# Patient Record
Sex: Male | Born: 1970 | Race: Black or African American | Hispanic: No | Marital: Married | Smoking: Never smoker
Health system: Southern US, Community
[De-identification: ages and names within clinical notes are randomized; demographics above are authoritative.]

## PROBLEM LIST (undated history)

## (undated) HISTORY — PX: HERNIA REPAIR: SHX51

---

## 2012-05-17 ENCOUNTER — Emergency Department: Payer: Self-pay | Admitting: Internal Medicine

## 2012-05-17 LAB — COMPREHENSIVE METABOLIC PANEL
Albumin: 3.9 g/dL (ref 3.4–5.0)
Alkaline Phosphatase: 63 U/L (ref 50–136)
Anion Gap: 8 (ref 7–16)
BUN: 11 mg/dL (ref 7–18)
Bilirubin,Total: 0.3 mg/dL (ref 0.2–1.0)
Calcium, Total: 8.6 mg/dL (ref 8.5–10.1)
Creatinine: 1.18 mg/dL (ref 0.60–1.30)
EGFR (African American): >60
Glucose: 122 mg/dL — ABNORMAL HIGH (ref 65–99)
Potassium: 3.4 mmol/L — ABNORMAL LOW (ref 3.5–5.1)
SGOT(AST): 24 U/L (ref 15–37)
Total Protein: 7.5 g/dL (ref 6.4–8.2)

## 2012-05-17 LAB — URINALYSIS, COMPLETE
Bacteria: NONE SEEN
Glucose,UR: NEGATIVE mg/dL (ref 0–75)
Ketone: NEGATIVE
Leukocyte Esterase: NEGATIVE
Specific Gravity: 1.019 (ref 1.003–1.030)
WBC UR: 1 /HPF (ref 0–5)

## 2012-05-17 LAB — CBC
MCH: 30.5 pg (ref 26.0–34.0)
MCHC: 33.4 g/dL (ref 32.0–36.0)
MCV: 91 fL (ref 80–100)
Platelet: 130 10*3/uL — ABNORMAL LOW (ref 150–440)
RDW: 12.7 % (ref 11.5–14.5)

## 2013-08-03 ENCOUNTER — Ambulatory Visit: Payer: Self-pay

## 2013-08-14 ENCOUNTER — Encounter: Payer: Self-pay | Admitting: Nurse Practitioner

## 2013-09-06 ENCOUNTER — Encounter: Payer: Self-pay | Admitting: Nurse Practitioner

## 2013-09-17 ENCOUNTER — Ambulatory Visit: Payer: Self-pay | Admitting: Orthopedic Surgery

## 2013-09-21 LAB — WOUND CULTURE

## 2014-10-30 NOTE — Op Note (Signed)
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   PROCEDURE: Irrigation and debridement of lateral ankle with removal of deep hardware distal fibula and tibia.   ANESTHESIA: General.   SURGEON: Kennedy BuckerMichael Antwoine Zorn, Decker.D.   DESCRIPTION OF PROCEDURE: The patient was brought to the operating room and after adequate anesthesia was obtained, the leg was prepped and draped in the usual sterile fashion with the patient in a lateral decubitus position with a bean bag holding him in this position. The left leg was prepped and draped in the usual sterile fashion with a tourniquet applied to the upper thigh and appropriate patient identification and timeout procedure were completed. There were 2 areas of drainage just anterior to the prior surgical scar on the lateral ankle and approximately 1 cm ulcerated area medially, both of these were cultured. The tourniquet was raised and the prior posterior incision was opened. There was dense scar tissue adherent all the way to essentially the posterior aspect of the tibia without good tissue planes. When the plate was exposed, a third culture was obtained. Subsequently with exposure of the plate, there was bony overgrowth consistent with osteomyelitis. This was removed with the use of an osteotome and this also was sent as a specimen. The fibular plate was removed with the 5 screws removed and, what appeared to be,  sequestrum at the proximal end of the plate also debrided with use of rongeur and osteotome.   Next, the posterior screw from the distal tibia was exposed. It was difficult to remove secondary to its head being embedded the soft tissues. Care being taken not to damage the Achilles tendon as it appeared to be embedded deep in this area. It was  worked out and removed. A curette was then used to debride the hole from which it had come. At this point, the wound was thoroughly irrigated with pulsatile lavage with the medial and lateral small wounds irrigated as well as the large posterior wound. Next, the tourniquet was let down and hemostasis checked with electrocautery. The wound was closed with vertical mattress sutures. Xeroform, 4 x 4, Webril and Ace wrap for postop bandage. The patient was sent to the recovery room in stable condition.   ESTIMATED BLOOD LOSS: Minimal.   TOURNIQUET TIME: 57 minutes at 325 mmHg.   SPECIMEN: Multiple cultures. Hardware was discarded after picture obtained. The hardware had no evidence of fracture or failure. There were small areas of purulent material present. This appeared to be thoroughly irrigated by the time of the close of the case.    ____________________________ Leitha SchullerMichael J. Virda Betters, MD mjm:aw D: 09/17/2013 18:34:47 ET T: 09/18/2013 06:24:50 ET JOB#: [id number]  cc: Leitha SchullerMichael J. Maliha Outten, MD, <Dictator> Leitha SchullerMICHAEL J Yves Fodor MD ELECTRONICALLY SIGNED 09/18/2013 10:58

## 2018-06-28 ENCOUNTER — Emergency Department (HOSPITAL_COMMUNITY)
Admission: EM | Admit: 2018-06-28 | Discharge: 2018-06-28 | Disposition: A | Discharge status: Home / Self Care | Payer: Self-pay | Attending: Emergency Medicine | Admitting: Emergency Medicine

## 2018-06-28 ENCOUNTER — Other Ambulatory Visit: Payer: Self-pay

## 2018-06-28 ENCOUNTER — Emergency Department (HOSPITAL_COMMUNITY): Payer: Self-pay

## 2018-06-28 ENCOUNTER — Encounter (HOSPITAL_COMMUNITY): Payer: Self-pay | Admitting: Emergency Medicine

## 2018-06-28 DIAGNOSIS — R51 Headache: Secondary | ICD-10-CM | POA: Insufficient documentation

## 2018-06-28 DIAGNOSIS — R519 Headache, unspecified: Secondary | ICD-10-CM

## 2018-06-28 LAB — CBC WITH DIFFERENTIAL/PLATELET
Abs Immature Granulocytes: 0.02 10*3/uL (ref 0.00–0.07)
BASOS PCT: 1 %
Basophils Absolute: 0 10*3/uL (ref 0.0–0.1)
EOS ABS: 0.1 10*3/uL (ref 0.0–0.5)
Eosinophils Relative: 1 %
HCT: 42.5 % (ref 39.0–52.0)
Hemoglobin: 13.8 g/dL (ref 13.0–17.0)
Immature Granulocytes: 0 %
Lymphocytes Relative: 52 %
Lymphs Abs: 3.3 10*3/uL (ref 0.7–4.0)
MCH: 29.7 pg (ref 26.0–34.0)
MCHC: 32.5 g/dL (ref 30.0–36.0)
MCV: 91.6 fL (ref 80.0–100.0)
Monocytes Absolute: 0.6 10*3/uL (ref 0.1–1.0)
Monocytes Relative: 9 %
Neutro Abs: 2.4 10*3/uL (ref 1.7–7.7)
Neutrophils Relative %: 37 %
PLATELETS: DECREASED 10*3/uL (ref 150–400)
RBC: 4.64 MIL/uL (ref 4.22–5.81)
RDW: 12.1 % (ref 11.5–15.5)
WBC: 6.4 10*3/uL (ref 4.0–10.5)
nRBC: 0 % (ref 0.0–0.2)

## 2018-06-28 LAB — URINALYSIS, ROUTINE W REFLEX MICROSCOPIC
Bilirubin Urine: NEGATIVE
GLUCOSE, UA: NEGATIVE mg/dL
Hgb urine dipstick: NEGATIVE
Ketones, ur: 15 mg/dL — AB
Leukocytes, UA: NEGATIVE
Nitrite: NEGATIVE
Protein, ur: NEGATIVE mg/dL
Specific Gravity, Urine: 1.02 (ref 1.005–1.030)
pH: 6 (ref 5.0–8.0)

## 2018-06-28 LAB — BASIC METABOLIC PANEL
Anion gap: 9 (ref 5–15)
BUN: 11 mg/dL (ref 6–20)
CALCIUM: 8.6 mg/dL — AB (ref 8.9–10.3)
CO2: 27 mmol/L (ref 22–32)
Chloride: 104 mmol/L (ref 98–111)
Creatinine, Ser: 1.24 mg/dL (ref 0.61–1.24)
GFR calc Af Amer: >60 mL/min (ref >60)
GFR calc non Af Amer: >60 mL/min (ref >60)
Glucose, Bld: 89 mg/dL (ref 70–99)
Potassium: 3.6 mmol/L (ref 3.5–5.1)
Sodium: 140 mmol/L (ref 135–145)

## 2018-06-28 MED ORDER — DIPHENHYDRAMINE HCL 50 MG/ML IJ SOLN
12.5000 mg | Freq: Once | INTRAMUSCULAR | Status: AC
  Administered 2018-06-28: 12.5 mg via INTRAVENOUS
  Filled 2018-06-28: qty 1

## 2018-06-28 MED ORDER — PROCHLORPERAZINE EDISYLATE 10 MG/2ML IJ SOLN
10.0000 mg | Freq: Once | INTRAMUSCULAR | Status: AC
  Administered 2018-06-28: 10 mg via INTRAVENOUS
  Filled 2018-06-28: qty 2

## 2018-06-28 MED ORDER — SODIUM CHLORIDE 0.9 % IV BOLUS
1000.0000 mL | Freq: Once | INTRAVENOUS | Status: AC
  Administered 2018-06-28: 1000 mL via INTRAVENOUS

## 2018-06-28 MED ORDER — HALOPERIDOL LACTATE 5 MG/ML IJ SOLN
2.0000 mg | Freq: Once | INTRAMUSCULAR | Status: AC
  Administered 2018-06-28: 2 mg via INTRAVENOUS
  Filled 2018-06-28: qty 1

## 2018-06-28 MED ORDER — KETOROLAC TROMETHAMINE 15 MG/ML IJ SOLN
15.0000 mg | Freq: Once | INTRAMUSCULAR | Status: AC
  Administered 2018-06-28: 15 mg via INTRAVENOUS
  Filled 2018-06-28: qty 1

## 2018-06-28 MED ORDER — NAPROXEN 500 MG PO TABS: 500.0000 mg | ORAL_TABLET | Freq: Two times a day (BID) | ORAL | 0 refills | Status: DC

## 2018-06-28 NOTE — ED Notes (Signed)
Patient verbalizes understanding of discharge instructions. Opportunity for questioning and answers were provided. Armband removed by staff, pt discharged from ED in wheelchair with family.  

## 2018-06-28 NOTE — ED Notes (Signed)
Nurse drawing labs. 

## 2018-06-28 NOTE — ED Provider Notes (Signed)
[facility] EMERGENCY DEPARTMENT Provider Note   CSN: [id number] Arrival date & time: 06/28/18  1855     History   Chief Complaint Chief Complaint  Patient presents with  . Headache  . Fever    HPI Tyler Decker is a 47 y.o. male without significant past medical hx who presents to the ED with complaints of headache for the past 5 days. Patient states headache is located in the frontal area and radiates to the back of the head/neck bilaterally. Pain is a throbbing sensation. States it started gradually and has progressively worsened. Current pain is an 8/10 in severity, he has some improvement w/ taking Excedrin, has not had any Excedrin today. Pain is worse with bright lights and w/ bending over/movements. He states hx of similar headaches that occur approximately once a year with resolution. States he has also had some fevers (temp max 101.7), chills, decreased appetite, and left lower tooth ache. Denies nausea, vomiting, chest pain, dyspnea, syncope, abdominal pain, change in vision, numbness, or weakness.   HPI  History reviewed. No pertinent past medical history.  There are no active problems to display for this patient.   Past Surgical History:  Procedure Laterality Date  . HERNIA REPAIR          Home Medications    Prior to Admission medications   Not on File    Family History No family history on file.  Social History Social History   Tobacco Use  . Smoking status: Not on file  Substance Use Topics  . Alcohol use: Not on file  . Drug use: Not on file     Allergies   Patient has no known allergies.   Review of Systems Review of Systems  Constitutional: Positive for appetite change, chills and fever.  HENT: Positive for dental problem. Negative for congestion, ear pain, sore throat, trouble swallowing and voice change.   Eyes: Positive for photophobia.  Respiratory: Negative for cough and shortness of breath.   Cardiovascular:  Negative for chest pain.  Gastrointestinal: Negative for abdominal pain, nausea and vomiting.  Neurological: Positive for headaches. Negative for facial asymmetry, speech difficulty, weakness and numbness.  All other systems reviewed and are negative.  Physical Exam Updated Vital Signs BP 128/82   Pulse 60   Temp 99.8 F (37.7 C) (Oral)   Resp 16   SpO2 100%   Physical Exam Vitals signs and nursing note reviewed.  Constitutional:      General: He is not in acute distress.    Appearance: He is well-developed. He is not toxic-appearing.  HENT:     Head: Normocephalic and atraumatic.     Right Ear: Tympanic membrane, ear canal and external ear normal.     Left Ear: Tympanic membrane, ear canal and external ear normal.     Nose: Nose normal.     Mouth/Throat:     Mouth: Mucous membranes are moist.     Pharynx: No oropharyngeal exudate or posterior oropharyngeal erythema.     Comments: Scattered dental carries. No notable gingival erythema/swelling/fluctuance. No abscess noted. Posterior oropharynx is symmetric appearing. Patient tolerating own secretions without difficulty. No trismus. No drooling. No hot potato voice. No swelling beneath the tongue, submandibular compartment is soft.  Eyes:     General:        Right eye: No discharge.        Left eye: No discharge.     Conjunctiva/sclera: Conjunctivae normal.  Neck:  Musculoskeletal: Neck supple. No edema, erythema or crepitus.     Comments: ROM intact but patient is uncomfortable with flexion/extension of the neck.  Diffuse midline/paraspinal muscle tenderness, no point/focal vertebral tenderness, no palpable step off.  Cardiovascular:     Rate and Rhythm: Normal rate and regular rhythm.  Pulmonary:     Effort: Pulmonary effort is normal. No respiratory distress.     Breath sounds: Normal breath sounds. No wheezing, rhonchi or rales.  Abdominal:     General: There is no distension.     Palpations: Abdomen is soft.      Tenderness: There is no abdominal tenderness.  Skin:    General: Skin is warm and dry.     Findings: No rash.  Neurological:     Mental Status: He is alert.     Comments: Alert. Clear speech. No facial droop. CNIII-XII grossly intact. Bilateral upper and lower extremities' sensation grossly intact. 5/5 symmetric strength with grip strength and with plantar and dorsi flexion bilaterally. Normal finger to nose bilaterally. Negative pronator drift. Negative Romberg sign. Gait is steady and intact.   Psychiatric:        Behavior: Behavior normal.     ED Treatments / Results  Labs (all labs ordered are listed, but only abnormal results are displayed) Labs Reviewed - No data to display  EKG None  Radiology No results found.  Procedures Procedures (including critical care time)  Medications Ordered in ED Medications - No data to display   Initial Impression / Assessment and Plan / ED Course  I have reviewed the triage vital signs and the nursing notes.  Pertinent labs & imaging results that were available during my care of the patient were reviewed by me and considered in my medical decision making (see chart for details).   Patient presents to the ED with 4-5 days of progressively worsening headache w/ associated fever & chills. Exam with some mild neck stiffness, but ROM intact. No neuro deficits noted. Infectious work-up w/ head CT unremarkable. No leukocytosis. No anemia. No significant electrolyte disturbance- mild hypocalcemia to 8.6. UA negative. CXR negative. CT head negative for bleed or mass. Feel that bacterial meningitis is very unlikely given duration of sxs and patient overall nontoxic appearing. Viral meningitis remains possible, doubt HSV meningitis. Myself & Dr. Pilar PlateBero discussed lumbar puncture for further definitive assessment with patient & his wife, risks/benefits/alternatives discussed, w/ shared decision making patient does not wish to have LP performed, this does not  seem unreasonable- again feel that bacterial meningitis is very unlikely. Presentation additionally does not seem consistent with SAH, ICH, ischemic CVA, dural venous sinus thrombosis, acute glaucoma, giant cell arteritis, ludwigs angina, or mass. Patient feeling better in the department following medications and feels ready to go home. He will be discharged w/ very strict return precautions. PCP follow up. I discussed results, treatment plan, need for follow-up, and return precautions with the patient & his wife. Provided opportunity for questions, patient & his wife confirmed understanding and are in agreement with plan.   Findings and plan of care discussed with supervising physician Dr. Pilar PlateBero who personally evaluated and examined this patient and is in agreement.   Final Clinical Impressions(s) / ED Diagnoses   Final diagnoses:  Acute nonintractable headache, unspecified headache type    ED Discharge Orders         Ordered    naproxen (NAPROSYN) 500 MG tablet  2 times daily     06/28/18 2321  Cherly Andersonetrucelli,  R, PA-C 06/29/18 0021    Sabas SousBero, Michael M, MD 06/30/18 [phone]

## 2018-06-28 NOTE — Discharge Instructions (Signed)
You were seen in the emergency department today for a headache.  Your work-up in the ER was overall reassuring.  The CT of your head was normal.  Your chest x-ray was normal.  Your blood work did not show any significant abnormalities.  As discussed there is always the possibility that this could be a viral meningitis and very much unlikely a bacterial meningitis.  For this reason is been extremely important that you follow-up with your primary care provider in 1 to 3 days.  Please take Excedrin as Naproxen prescribed.   - Naproxen is a nonsteroidal anti-inflammatory medication that will help with pain and swelling. Be sure to take this medication as prescribed with food, 1 pill every 12 hours,  It should be taken with food, as it can cause stomach upset, and more seriously, stomach bleeding. Do not take other nonsteroidal anti-inflammatory medications with this such as Advil, Motrin, Aleve, Mobic, Goodie Powder, or Motrin.    We have prescribed you new medication(s) today. Discuss the medications prescribed today with your pharmacist as they can have adverse effects and interactions with your other medicines including over the counter and prescribed medications. Seek medical evaluation if you start to experience new or abnormal symptoms after taking one of these medicines, seek care immediately if you start to experience difficulty breathing, feeling of your throat closing, facial swelling, or rash as these could be indications of a more serious allergic reaction   .  Please return to the ER immediately should you experience new or worsening symptoms including but not limited to worsening pain, inability to move the neck, inability to keep fluids down, mental status change, confusion, or any other concerns that you may have.

## 2018-06-28 NOTE — ED Triage Notes (Signed)
Pt states he has had a migraine for 2-3 days, has taken Excedrin with relief for only 2 hours then the pain comes back. Pt also states his temp at home 2 days ago was 101F oral. Pt states he recently has felt "wobbly" after standing up fast or bending down to tie his shoe after he has been standing for a while

## 2018-07-28 ENCOUNTER — Emergency Department (HOSPITAL_COMMUNITY): Payer: Self-pay

## 2018-07-28 ENCOUNTER — Encounter (HOSPITAL_COMMUNITY): Payer: Self-pay

## 2018-07-28 ENCOUNTER — Emergency Department (HOSPITAL_COMMUNITY)
Admission: EM | Admit: 2018-07-28 | Discharge: 2018-07-28 | Disposition: A | Discharge status: Home / Self Care | Payer: Self-pay | Attending: Emergency Medicine | Admitting: Emergency Medicine

## 2018-07-28 ENCOUNTER — Other Ambulatory Visit: Payer: Self-pay

## 2018-07-28 DIAGNOSIS — Y92410 Unspecified street and highway as the place of occurrence of the external cause: Secondary | ICD-10-CM | POA: Insufficient documentation

## 2018-07-28 DIAGNOSIS — T148XXA Other injury of unspecified body region, initial encounter: Secondary | ICD-10-CM

## 2018-07-28 DIAGNOSIS — S20212A Contusion of left front wall of thorax, initial encounter: Secondary | ICD-10-CM | POA: Insufficient documentation

## 2018-07-28 DIAGNOSIS — Y998 Other external cause status: Secondary | ICD-10-CM | POA: Insufficient documentation

## 2018-07-28 DIAGNOSIS — S298XXA Other specified injuries of thorax, initial encounter: Secondary | ICD-10-CM

## 2018-07-28 DIAGNOSIS — Y9389 Activity, other specified: Secondary | ICD-10-CM | POA: Insufficient documentation

## 2018-07-28 DIAGNOSIS — S161XXA Strain of muscle, fascia and tendon at neck level, initial encounter: Secondary | ICD-10-CM | POA: Insufficient documentation

## 2018-07-28 MED ORDER — KETOROLAC TROMETHAMINE 60 MG/2ML IM SOLN
30.0000 mg | Freq: Once | INTRAMUSCULAR | Status: AC
  Administered 2018-07-28: 30 mg via INTRAMUSCULAR
  Filled 2018-07-28: qty 2

## 2018-07-28 MED ORDER — METHOCARBAMOL 500 MG PO TABS: 500.0000 mg | ORAL_TABLET | Freq: Two times a day (BID) | ORAL | 0 refills | Status: AC

## 2018-07-28 MED ORDER — CYCLOBENZAPRINE HCL 10 MG PO TABS
10.0000 mg | ORAL_TABLET | Freq: Once | ORAL | Status: AC
  Administered 2018-07-28: 10 mg via ORAL
  Filled 2018-07-28: qty 1

## 2018-07-28 MED ORDER — NAPROXEN 500 MG PO TABS: 500.0000 mg | ORAL_TABLET | Freq: Two times a day (BID) | ORAL | 0 refills | Status: AC
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
## 2018-07-28 NOTE — Discharge Instructions (Addendum)
Do not take the muscle relaxer if driving or working as it will make you sleepy. Follow up with your doctor or return here for worsening symptoms.
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
## 2020-07-22 IMAGING — CT CT HEAD W/O CM
4 series · 16 of 47 positions shown, 18 images · non-contrast
Comparison: None.

CLINICAL DATA: Migraine headache over the last 2-3 days. Fever.
Gait disturbance.

EXAM:
CT HEAD WITHOUT CONTRAST
TECHNIQUE: Contiguous axial images were obtained from the base of the skull
through the vertex without intravenous contrast.

[Series 3: head without · axial · non-contrast · 0.42mm/px · z∈[-93,+27]mm · 7 of 32 slices shown, 9 images]
[im 4/32  brain]
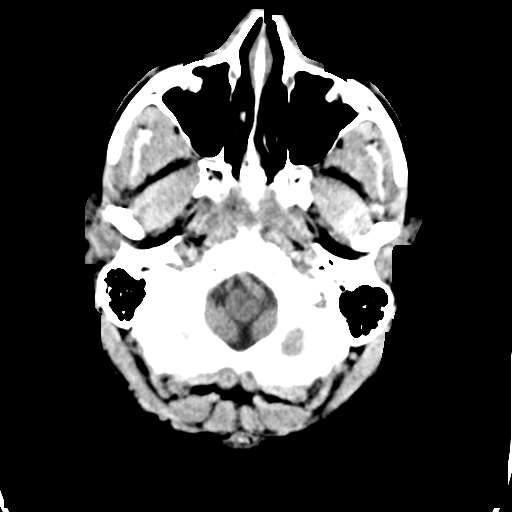
[im 4/32  bone]
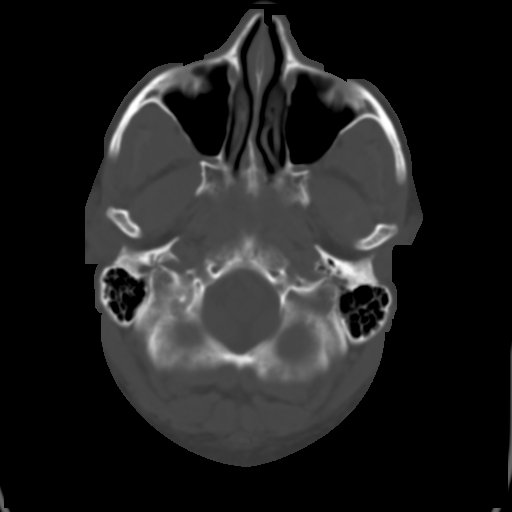
[im 8/32  brain]
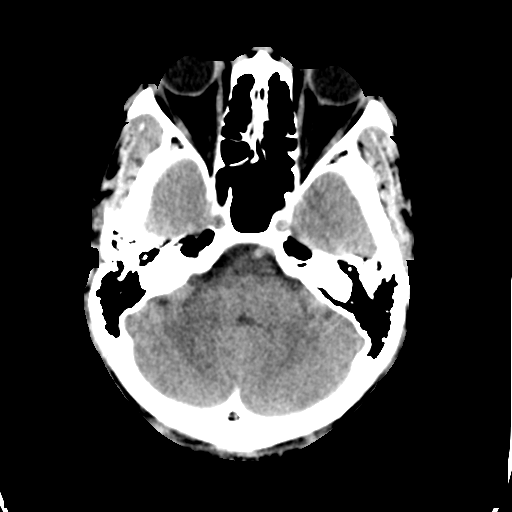
[im 12/32  brain]
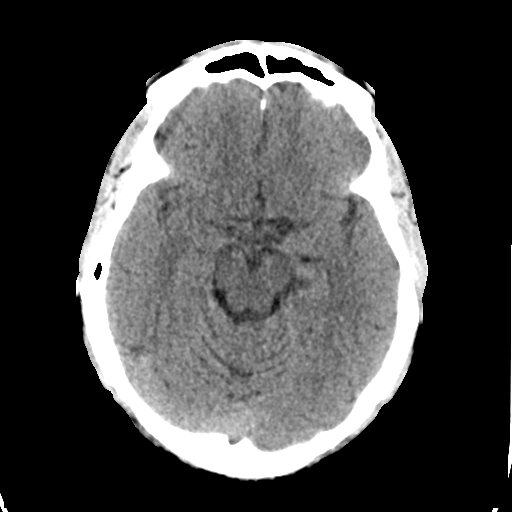
[im 16/32  brain]
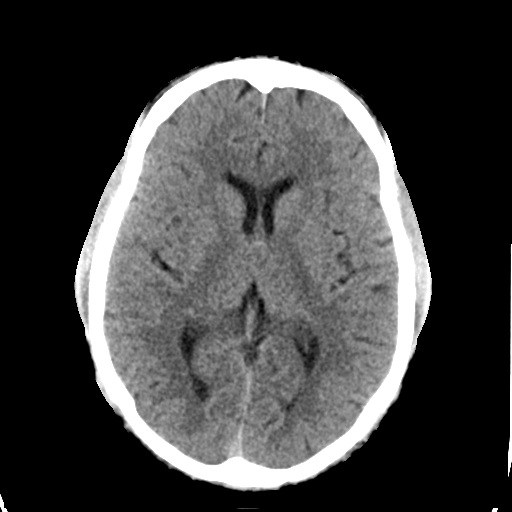
[im 20/32  brain]
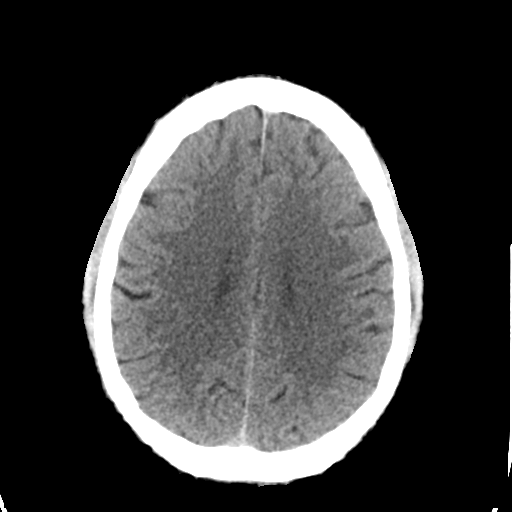
[im 20/32  bone]
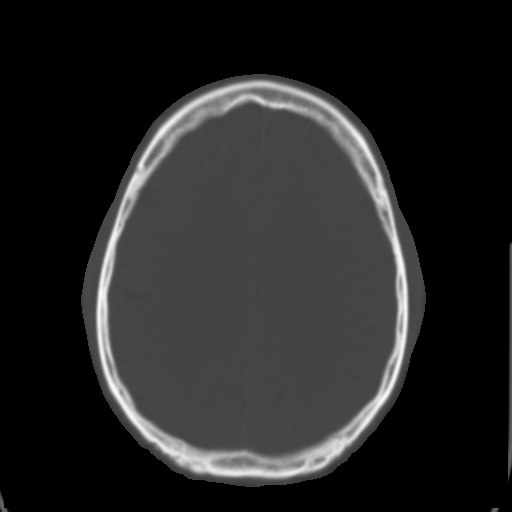
[im 24/32  brain]
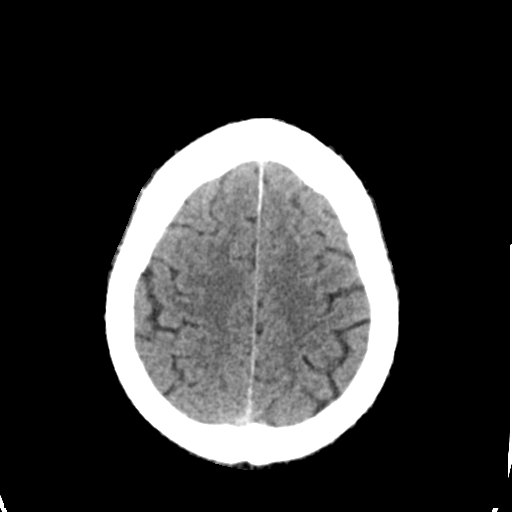
[im 28/32  brain]
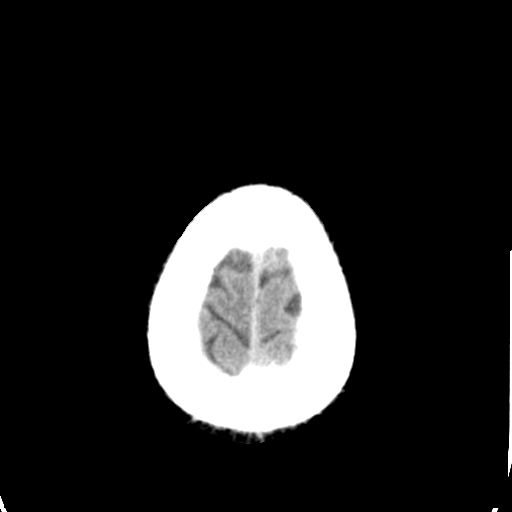

[Series 4: head bone · axial · 0.42mm/px · z∈[-94,-62]mm · 3 of 79 slices shown]
[im 8/79  bone]
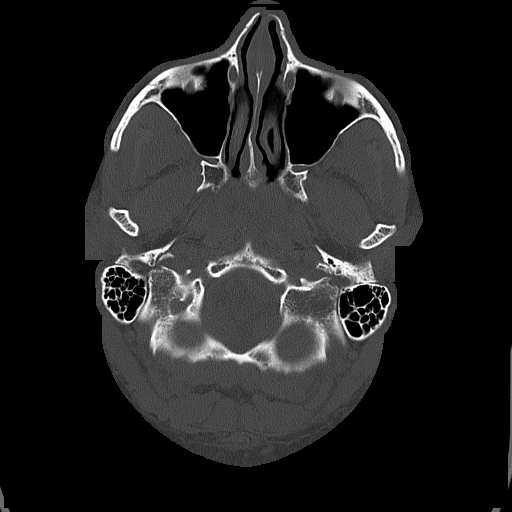
[im 16/79  bone]
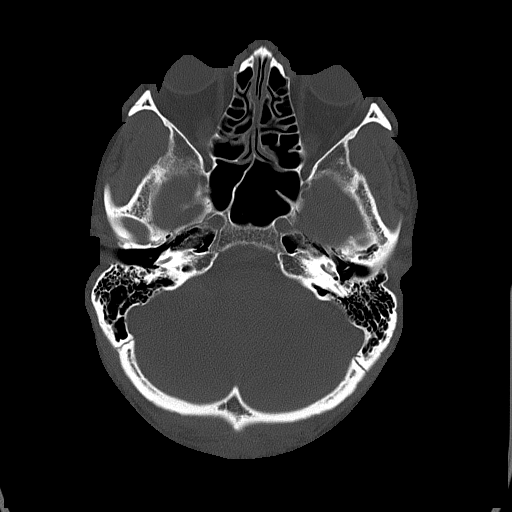
[im 24/79  bone]
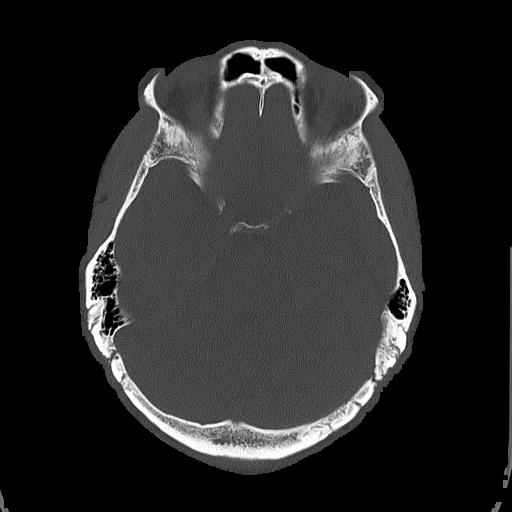

[Series 5: head without cor · coronal · non-contrast · 0.30mm/px · 3 of 70 slices shown]
[im 24/70  brain]
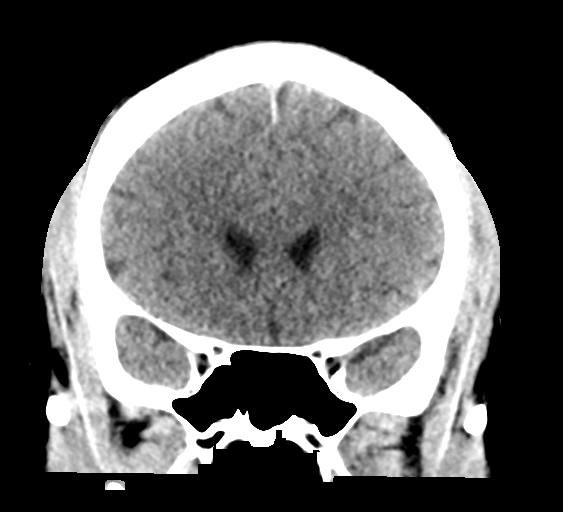
[im 31/70  brain]
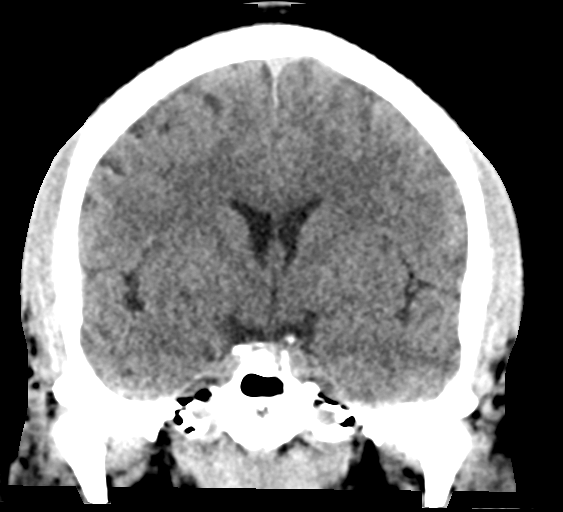
[im 39/70  brain]
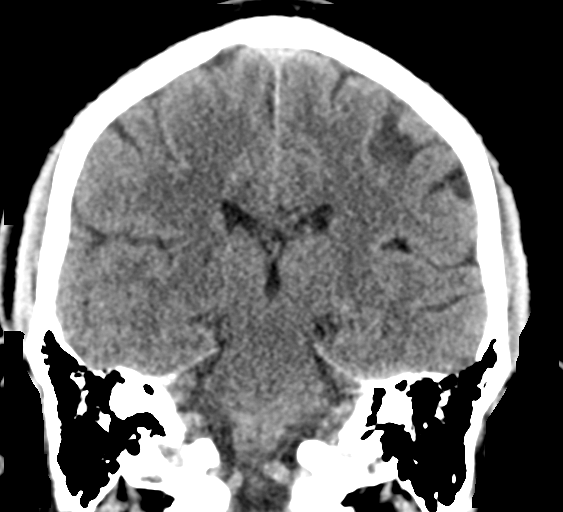

[Series 6: head without sag · sagittal · non-contrast · 0.31mm/px · 3 of 56 slices shown]
[im 19/56  brain]
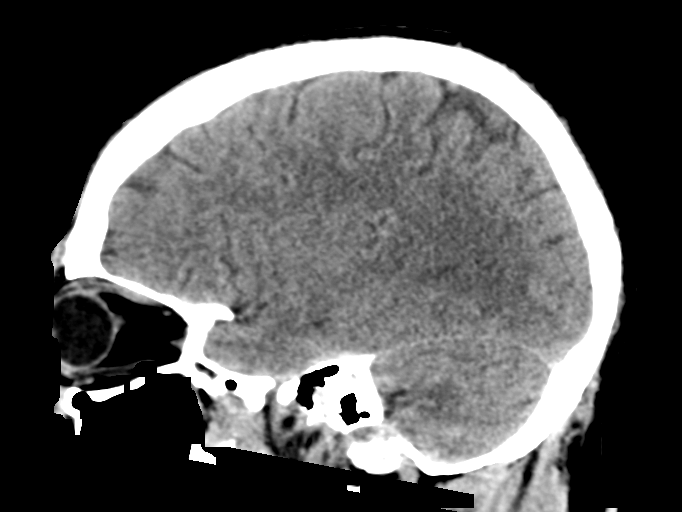
[im 28/56  brain]
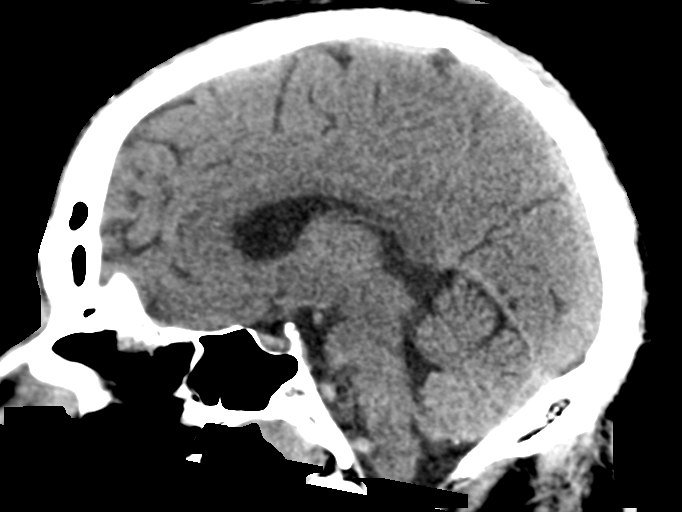
[im 37/56  brain]
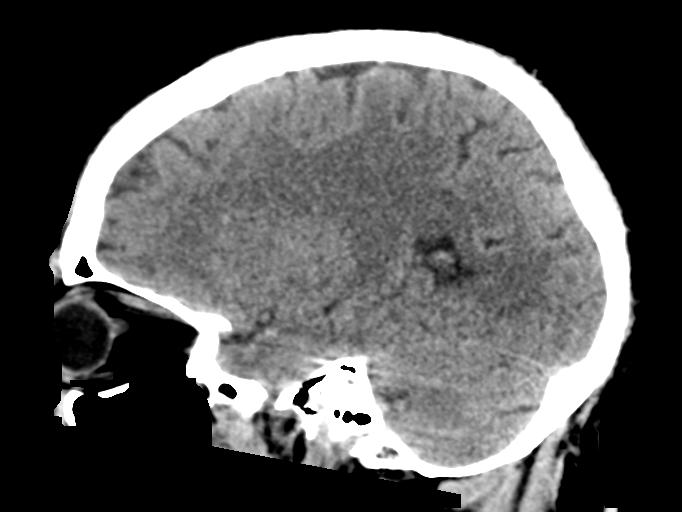

[16 of 47 positions shown; findings below may reference images not displayed]

FINDINGS: Brain: The brain shows a normal appearance without evidence of
malformation, atrophy, old or acute small or large vessel
infarction, mass lesion, hemorrhage, hydrocephalus or extra-axial
collection.

Vascular: No hyperdense vessel. No evidence of atherosclerotic
calcification.

Skull: Normal. No traumatic finding. No focal bone lesion.

Sinuses/Orbits: Sinuses are clear. Orbits appear normal. Mastoids
are clear.

Other: None significant
IMPRESSION: Normal head CT.
